# Patient Record
Sex: Male | Born: 1990 | Race: White | Hispanic: No | Marital: Single | Smoking: Never smoker
Health system: Southern US, Community
[De-identification: ages and names within clinical notes are randomized; demographics above are authoritative.]

## PROBLEM LIST (undated history)

## (undated) HISTORY — PX: TONSILECTOMY/ADENOIDECTOMY WITH MYRINGOTOMY: SHX6125

## (undated) HISTORY — PX: WISDOM TOOTH EXTRACTION: SHX21

---
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
## 2010-08-09 NOTE — Procedures (Signed)
CLINICAL INFORMATION:  This patient is being evaluated for an episode of  loss of consciousness.   TECHNICAL DESCRIPTION:  This EEG was recorded in the awake and stage 2 sleep  states. The awake background rhythm is well formed 11 and 12 Hertz rhythms  with higher amplitude seen in the posterior head regions bilaterally. No  evidence of any focal asymmetry is present. C-4 electrode artifact is  recorded during this study. Hyperventilation testing was performed without  any abnormalities. Photic stimulation did not produce a driving response.  There was no evidence of any focal asymmetry. Stage 2 sleep was noted. There  was no epileptiform activity present.   IMPRESSION:  This is a normal EEG during the awake and stage 2 sleep states.           ______________________________  Genene Churn. Sandria Manly, M.D.     ZOX:[id number]  D:  07/23/2005 11:38:09  T:  07/23/2005 23:37:02  Job #:  [id number]

## 2010-08-09 NOTE — Op Note (Signed)
NAMEKRYSTIAN, YOUNGLOVE NO.:  [id number]   MEDICAL RECORD NO.:  [id number]          PATIENT TYPE:  EMS   LOCATION:  MAJO                         FACILITY:  [facility]   PHYSICIAN:  Kinnie Scales. Annalee Genta, M.D.DATE OF BIRTH:  08-01-1990   DATE OF PROCEDURE:  02/17/2006  DATE OF DISCHARGE:                                 OPERATIVE REPORT   PRE AND POSTOPERATIVE DIAGNOSIS AND INDICATIONS FOR SURGERY:  1. Post tonsillectomy hemorrhage.   SURGICAL PROCEDURES:  1. Examination cautery under anesthesia with control post tonsillectomy      hemorrhage.  2. Gastric lavage.   ANESTHESIA:  General endotracheal,   SURGEON:  David L. Annalee Genta, M.D.   COMPLICATIONS:  None.   BLOOD LOSS:  Less than 50 mL.   The patient transferred from the operating room to recovery in stable  condition.   BRIEF HISTORY:  Eiden is a 20 year old white male who underwent  tonsillectomy on 01/2006 by Dr. Suzanna Obey.  The patient had a history of  recurrent acute tonsillitis and tonsillar hypertrophy and surgical procedure  was performed without complication or difficulty and he was discharged later  the same day.  The patient was stable and doing well in the postoperative  period until the morning of 02/17/2006 when he awoke at approximately 2:00  a.m. with episodic oropharyngeal bleeding.  The patient was trans was  brought to the [facility] emergency department and given his  history, examination which showed active bleeding from the left tonsillar  fossa and a large clot, I recommended we undertake exam under anesthesia  with control of hemorrhage and gastric lavage.  Risks, benefits and possible  complications of surgical procedure discussed in detail with the patient and  his parents and they understood and concurred with our plan for surgery  which is scheduled on an emergent basis at [facility] OR.   SURGICAL PROCEDURE:  The patient brought to the operating room at  the main  OR on 02/17/2006, placed supine position on the operating table.  General  endotracheal anesthesia established without difficulty.  There is no  evidence of aspiration.  With the patient adequately anesthetized, he was  positioned on the operating table and prepped and draped in sterile fashion  and Crowe-Davis mouth gag was inserted without difficulty.  The oral cavity,  oropharynx were thoroughly examined.  The patient had a large organized clot  in the left tonsillar fossa.  This was removed and active bleeding was noted  from the mid aspect of the left tonsillar fossa.  This area was cauterized  with suction cautery.  Tonsillar fossa were gently abraded with a dry tonsil  sponge and several other small areas of point hemorrhage were cauterized  with suction cautery.  Crowe-Davis mouth gag was released and reapplied.  There is no active bleeding, oral cavity and oropharynx were then irrigated  and suctioned.   Gastric lavage was undertaken.  A 16-French orogastric tube was then  inserted without difficulty.  There was moderate amount approximately 150 mL  of clotted blood aspirated from the stomach.  The patient was then lavaged  with a total of approximately 500 mL of warm normal saline gastric lavage in  order to clear the stomach of additional blood and clotted material.  With  lavage fluid running clear, the orogastric tube was removed without  difficulty.  The patient's oral cavity, oropharynx were then examined and  there was no active bleeding.  The patient's Crowe-Davis mouth gag was  removed.  No loose or broken teeth.  No bleeding.  He was awakened from the  anesthetic, extubated, transferred from the operating room to recovery in  stable condition.  There were no complications.           ______________________________  Kinnie Scales Annalee Genta, M.D.     DLS/MEDQ  D:  16/12/9602  T:  02/17/2006  Job:  [id number]

## 2012-05-18 ENCOUNTER — Ambulatory Visit (INDEPENDENT_AMBULATORY_CARE_PROVIDER_SITE_OTHER): Payer: 59 | Admitting: Internal Medicine

## 2012-05-18 ENCOUNTER — Telehealth: Payer: Self-pay | Admitting: Family Medicine

## 2012-05-18 VITALS — BP 119/79 | HR 80 | Temp 98.1°F | Resp 16 | Ht 73.0 in | Wt 205.0 lb

## 2012-05-18 DIAGNOSIS — F411 Generalized anxiety disorder: Secondary | ICD-10-CM

## 2012-05-18 DIAGNOSIS — G4700 Insomnia, unspecified: Secondary | ICD-10-CM

## 2012-05-18 MED ORDER — FLUOXETINE HCL 20 MG PO TABS: 20.0000 mg | ORAL_TABLET | Freq: Every day | ORAL | Status: DC

## 2012-05-18 MED ORDER — ALPRAZOLAM 0.5 MG PO TABS: 0.5000 mg | ORAL_TABLET | Freq: Three times a day (TID) | ORAL | Status: DC | PRN
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
## 2012-05-18 NOTE — Patient Instructions (Signed)
"  Overcoming anxiety for dummies" SLEEP! Very important

## 2012-05-19 NOTE — Progress Notes (Signed)
Overbook appt made for 3/12 per provider.

## 2012-06-02 ENCOUNTER — Other Ambulatory Visit: Payer: Self-pay | Admitting: Internal Medicine

## 2012-06-02 ENCOUNTER — Encounter: Payer: Self-pay | Admitting: Internal Medicine

## 2012-06-02 ENCOUNTER — Telehealth: Payer: Self-pay

## 2012-06-02 ENCOUNTER — Ambulatory Visit (INDEPENDENT_AMBULATORY_CARE_PROVIDER_SITE_OTHER): Payer: 59 | Admitting: Internal Medicine

## 2012-06-02 VITALS — BP 128/82 | HR 75 | Temp 97.7°F | Resp 16 | Ht 73.0 in | Wt 210.0 lb

## 2012-06-02 DIAGNOSIS — R61 Generalized hyperhidrosis: Secondary | ICD-10-CM

## 2012-06-02 DIAGNOSIS — Z0000 Encounter for general adult medical examination without abnormal findings: Secondary | ICD-10-CM

## 2012-06-02 DIAGNOSIS — F411 Generalized anxiety disorder: Secondary | ICD-10-CM

## 2012-06-02 LAB — COMPREHENSIVE METABOLIC PANEL
ALT: 78 U/L — ABNORMAL HIGH (ref 0–53)
Albumin: 5 g/dL (ref 3.5–5.2)
Alkaline Phosphatase: 45 U/L (ref 39–117)
Glucose, Bld: 81 mg/dL (ref 70–99)
Potassium: 3.8 mEq/L (ref 3.5–5.3)
Sodium: 139 mEq/L (ref 135–145)
Total Bilirubin: 0.7 mg/dL (ref 0.3–1.2)
Total Protein: 7.6 g/dL (ref 6.0–8.3)

## 2012-06-02 LAB — LIPID PANEL
Cholesterol: 161 mg/dL (ref 0–200)
Total CHOL/HDL Ratio: 3.7 Ratio

## 2012-06-02 LAB — CBC WITH DIFFERENTIAL/PLATELET
Basophils Absolute: 0 10*3/uL (ref 0.0–0.1)
Basophils Relative: 1 % (ref 0–1)
Eosinophils Absolute: 0.3 10*3/uL (ref 0.0–0.7)
Eosinophils Relative: 4 % (ref 0–5)
MCH: 32.3 pg (ref 26.0–34.0)
MCHC: 36 g/dL (ref 30.0–36.0)
MCV: 89.8 fL (ref 78.0–100.0)
Neutrophils Relative %: 54 % (ref 43–77)
Platelets: 226 10*3/uL (ref 150–400)
RBC: 5.08 MIL/uL (ref 4.22–5.81)
RDW: 12.8 % (ref 11.5–15.5)

## 2012-06-02 MED ORDER — ALPRAZOLAM 1 MG PO TABS: 1.0000 mg | ORAL_TABLET | Freq: Three times a day (TID) | ORAL | Status: DC | PRN

## 2012-06-02 MED ORDER — ALUMINUM CHLORIDE 20 % EX SOLN: Freq: Every day | CUTANEOUS | Status: DC

## 2012-06-02 MED ORDER — FLUOXETINE HCL 20 MG PO TABS: 20.0000 mg | ORAL_TABLET | Freq: Every day | ORAL | Status: DC

## 2012-06-02 NOTE — Telephone Encounter (Signed)
Pt wanted to inform Dr.Doolittle that he had a tetanus shot on- 06/26/2005. If any ?'s please call: [phone]

## 2012-06-02 NOTE — Progress Notes (Signed)
  Subjective:    Patient ID: Kenneth Townsend, male    DOB: [birth year], 22 y.o.   MRN: [id number]  HPIhere for PE  and f/u GAD--sleeping very well /situational anxiety, pervasive anxiety controlled at 1 mg 3 times a day but continues with excessive sweating   Lifts trying to bulk up/occas aerobics/protein supplements Sl wt gain PMH- Sh-new job/steady GF FH-parents healthy-Dad had anx Tet 2007   Review of Systems  Constitutional: Positive for diaphoresis.  Psychiatric/Behavioral: Positive for sleep disturbance. The patient is nervous/anxious.   remainder negative     Objective:   Physical Exam BP 128/82  Pulse 75  Temp(Src) 97.7 F (36.5 C)  Resp 16  Ht 6\' 1"  (1.854 m)  Wt 210 lb (95.255 kg)  BMI 27.71 kg/m2 HEENT clear Heart regular without murmur Lungs clear No thyromegaly or lymphadenopathy Abdomen supple without organomegaly No testicular masses or inguinal hernia Spine straight/straight leg raise normal Extremities clear/good range of motion about all joints Neurological intact Mood good/affect appropriate       Assessment & Plan:  Annual physical Problem #1 generalized anxiety disorder Problem #2 hyperhidrosis   continue Prozac Increase Xanax to 1 mg-half to one as needed 3 times a day drysol Followup in 3 months to assess whether Xanax can be discontinued or Prozac needs to be increased

## 2012-06-03 NOTE — Telephone Encounter (Signed)
Abstract inti immun please

## 2012-06-03 NOTE — Telephone Encounter (Signed)
done

## 2012-06-04 LAB — HEPATITIS C ANTIBODY: HCV Ab: NEGATIVE

## 2012-06-09 ENCOUNTER — Encounter: Payer: Self-pay | Admitting: Internal Medicine

## 2012-06-23 ENCOUNTER — Telehealth: Payer: Self-pay

## 2012-06-23 NOTE — Telephone Encounter (Signed)
PT SAYS HE RECEIVED AN EOB FROM HIS INSURANCE CO. FOR DOS 06-02-12.  HE SAID THE CODE 40981 WAS ON THERE FOR HEP C.  SAYS HE DID NOT HAVE THIS DONE AND THAT HE THINKS THERE WAS A TYPING ERROR IN PROCESSING HIS CLAIM.

## 2012-06-23 NOTE — Telephone Encounter (Signed)
ERROR

## 2012-07-06 ENCOUNTER — Telehealth: Payer: Self-pay

## 2012-07-06 NOTE — Telephone Encounter (Signed)
Called [facility] and spoke with [place] and changed patients Dx code to 790.4 for Hep C.  They will re-file patients insurance.  Spoke with patient and advised him they will re-file insurance.  Patient states understanding, he will call back if there are anymore questions or concerns.

## 2012-08-10 ENCOUNTER — Telehealth: Payer: Self-pay

## 2012-08-10 DIAGNOSIS — R7989 Other specified abnormal findings of blood chemistry: Secondary | ICD-10-CM | POA: Insufficient documentation

## 2012-08-10 DIAGNOSIS — R945 Abnormal results of liver function studies: Secondary | ICD-10-CM | POA: Insufficient documentation

## 2012-08-10 DIAGNOSIS — Z1159 Encounter for screening for other viral diseases: Secondary | ICD-10-CM | POA: Insufficient documentation

## 2012-08-10 NOTE — Telephone Encounter (Signed)
Spoke with patient, we have tried several codes to cover Hep C.  He's insurance still hasn't covered the Hep C.  Do you have another code we might be able to use?  Please advise

## 2012-08-10 NOTE — Telephone Encounter (Signed)
Patient Active Problem List   Diagnosis Date Noted  . Abnormal LFTs 08/10/2012  . Need for hepatitis C screening test 08/10/2012  . GAD (generalized anxiety disorder) 05/18/2012

## 2012-08-11 NOTE — Telephone Encounter (Signed)
Called [facility] and spoke with Trula Ore, changed code to V73.89.  Patient informed that Loney Loh, will resubmit claim.

## 2012-08-25 ENCOUNTER — Encounter: Payer: Self-pay | Admitting: Internal Medicine

## 2012-08-25 ENCOUNTER — Ambulatory Visit (INDEPENDENT_AMBULATORY_CARE_PROVIDER_SITE_OTHER): Payer: 59 | Admitting: Internal Medicine

## 2012-08-25 VITALS — BP 114/64 | HR 58 | Temp 97.6°F | Resp 16 | Ht 72.0 in | Wt 202.8 lb

## 2012-08-25 DIAGNOSIS — F411 Generalized anxiety disorder: Secondary | ICD-10-CM

## 2012-08-25 MED ORDER — ALPRAZOLAM 1 MG PO TABS: 1.0000 mg | ORAL_TABLET | Freq: Three times a day (TID) | ORAL | Status: DC | PRN

## 2012-08-25 MED ORDER — FLUOXETINE HCL 20 MG PO TABS: 20.0000 mg | ORAL_TABLET | Freq: Every day | ORAL | Status: DC

## 2012-08-25 NOTE — Progress Notes (Signed)
  Subjective:    Patient ID: Kenneth Townsend, male    DOB: [birth year], 22 y.o.   MRN: [id number]  HPI f/u for anxiety Has begun to improve after 5 weeks of Prozac requiring less Xanax Will move to [place] to start new job with [employer] and anticipates increase in anxiety Would like to continue to have access to Xanax which worked very well when this episode of anxiety started He understands that an increase in Prozac may be very beneficial as well No depression/suicide ideation Doing well with exercise Feels good in general/no further liver issues but will need repeat testing at a later followup    Review of Systems No fatigue weight loss or night sweats No change in activity Only requires drysol twice a month    Objective:   Physical Exam BP 114/64  Pulse 58  Temp(Src) 97.6 F (36.4 C) (Oral)  Resp 16  Ht 6' (1.829 m)  Wt 202 lb 12.8 oz (91.989 kg)  BMI 27.5 kg/m2  SpO2 99% Mood good/affect appropriate       Assessment & Plan:  GAD (generalized anxiety disorder) - Plan: FLUoxetine (PROZAC) 20 MG tablet, ALPRAZolam (XANAX) 1 MG tablet  Meds ordered this encounter  Medications  . FLUoxetine (PROZAC) 20 MG tablet    Sig: Take 1 tablet (20 mg total) by mouth daily.    Dispense:  90 tablet    Refill:  2  . ALPRAZolam (XANAX) 1 MG tablet    Sig: Take 1 tablet (1 mg total) by mouth 3 (three) times daily as needed.    Dispense:  90 tablet    Refill:  5   F/u 02/2013 reck LFTs plus hep c Ab

## 2013-04-05 ENCOUNTER — Other Ambulatory Visit: Payer: Self-pay | Admitting: Internal Medicine

## 2013-04-06 NOTE — Telephone Encounter (Signed)
faxed

## 2013-04-06 NOTE — Telephone Encounter (Signed)
Pt called and notified overdue for OV. Pt stated he will CB and set up appt.

## 2013-04-06 NOTE — Telephone Encounter (Signed)
Needs OV, was due for f/u 02/2013 per last OV note

## 2013-05-18 ENCOUNTER — Ambulatory Visit: Payer: 59 | Admitting: Internal Medicine

## 2013-05-24 ENCOUNTER — Ambulatory Visit (INDEPENDENT_AMBULATORY_CARE_PROVIDER_SITE_OTHER): Payer: Managed Care, Other (non HMO) | Admitting: Internal Medicine

## 2013-05-24 VITALS — BP 120/88 | HR 86 | Temp 98.0°F | Resp 16 | Ht 72.5 in | Wt 208.0 lb

## 2013-05-24 DIAGNOSIS — L74519 Primary focal hyperhidrosis, unspecified: Secondary | ICD-10-CM

## 2013-05-24 DIAGNOSIS — R61 Generalized hyperhidrosis: Secondary | ICD-10-CM

## 2013-05-24 DIAGNOSIS — F411 Generalized anxiety disorder: Secondary | ICD-10-CM

## 2013-05-24 DIAGNOSIS — G4700 Insomnia, unspecified: Secondary | ICD-10-CM

## 2013-05-24 MED ORDER — ALPRAZOLAM 1 MG PO TABS: 1.0000 mg | ORAL_TABLET | Freq: Three times a day (TID) | ORAL | Status: DC | PRN

## 2013-05-24 MED ORDER — FLUOXETINE HCL 20 MG PO TABS: 20.0000 mg | ORAL_TABLET | Freq: Every day | ORAL | Status: DC

## 2013-05-24 MED ORDER — ALUMINUM CHLORIDE 20 % EX SOLN: Freq: Every day | CUTANEOUS | Status: DC

## 2013-05-24 NOTE — Progress Notes (Addendum)
   Subjective:    Patient ID: Kenneth Townsend, male    DOB: 26-Feb-1991, 23 y.o.   MRN: [id number] This chart was scribed for Ellamae Siaobert Tram Wrenn, MD by Nicholos Johnsenise Iheanachor, Medical Scribe. This patient's care was started at 8:29 PM.  HPI HPI Comments: Kenneth Townsend is a 23 y.o. male who [facility] for a medication refill. Patient Active Problem List  . GAD (generalized anxiety disorder) 05/18/2012    -  Hyperhidrosis-axillary and palmar  Has been on Prozac for a while; ran out 1 month ago. Unsure if he has been having any withdrawals. Says he has noticed anxiety has returned full force but is unsure how much of that was attributed to his career versus the medication withdrawal. One recurring sx pt states he has noticed since coming off the medication is changed in sleep pattern. Says he has had many restless nights. Also uses Xanax once in the AM and another if needed sometime throughout the day. Reports success with the use of Drysol and states once coming off of that he noticed sx came back almost immediately. States he does have some hyperhidrosis with his hands. Pt is otherwise healthy. Tries to work out regularly. Has a girlfriend that cooks most of the meals and helps to maintain a healthy diet.  Review of Systems No headaches or visual disturbances No unusual weight gain No chest pain or palpitations No neurological symptoms No history of depression Past history of abnormal LFTs 05/2012 very mild with negative hep C  Objective:   Physical Exam  Nursing note and vitals reviewed. Constitutional: He is oriented to person, place, and time. He appears well-developed and well-nourished. No distress.  HENT:  Head: Normocephalic and atraumatic.  Eyes: Conjunctivae and EOM are normal. Pupils are equal, round, and reactive to light.  Neck: Neck supple.  Cardiovascular: Normal rate.   Pulmonary/Chest: Effort normal.  Neurological: He is alert and oriented to person, place, and time. No  cranial nerve deficit.  Psychiatric: He has a normal mood and affect. His behavior is normal.   Assessment & Plan:  I have completed the patient encounter in its entirety as documented by the scribe, with editing by me where necessary. Shenicka Sunderlin P. Merla Richesoolittle, M.D.  GAD (generalized anxiety disorder) - Plan: FLUoxetine (PROZAC) 20 MG tablet, ALPRAZolam (XANAX) 1 MG tablet----meds restarted  Hyperhidrosis - Plan: aluminum chloride (DRYSOL) 20 % external solution  Insomnia, unspecified  Meds ordered this encounter  Medications  . FLUoxetine (PROZAC) 20 MG tablet    Sig: Take 1 tablet (20 mg total) by mouth daily.    Dispense:  90 tablet    Refill:  3  . ALPRAZolam (XANAX) 1 MG tablet    Sig: Take 1 tablet (1 mg total) by mouth 3 (three) times daily as needed for anxiety.    Dispense:  90 tablet    Refill:  5  . aluminum chloride (DRYSOL) 20 % external solution    Sig: Apply topically at bedtime. once sweating is controlled may decrease use to one to 2 times per week    Dispense:  35 mL    Refill:  5   Followup 6 months or call if stable At next visit will recheck liver function studies

## 2013-07-06 ENCOUNTER — Encounter: Payer: 59 | Admitting: Internal Medicine

## 2013-07-18 NOTE — Progress Notes (Signed)
This encounter was created in error - please disregard.

## 2013-12-27 ENCOUNTER — Other Ambulatory Visit: Payer: Self-pay | Admitting: Internal Medicine

## 2013-12-28 NOTE — Telephone Encounter (Signed)
Called in.

## 2014-03-14 ENCOUNTER — Ambulatory Visit (INDEPENDENT_AMBULATORY_CARE_PROVIDER_SITE_OTHER): Payer: Managed Care, Other (non HMO) | Admitting: Family Medicine

## 2014-03-14 ENCOUNTER — Encounter: Payer: Self-pay | Admitting: Family Medicine

## 2014-03-14 VITALS — BP 115/78 | HR 75 | Temp 97.4°F | Resp 16 | Ht 73.0 in | Wt 204.0 lb

## 2014-03-14 DIAGNOSIS — R61 Generalized hyperhidrosis: Secondary | ICD-10-CM

## 2014-03-14 DIAGNOSIS — F411 Generalized anxiety disorder: Secondary | ICD-10-CM

## 2014-03-14 DIAGNOSIS — R945 Abnormal results of liver function studies: Secondary | ICD-10-CM

## 2014-03-14 DIAGNOSIS — R7989 Other specified abnormal findings of blood chemistry: Secondary | ICD-10-CM

## 2014-03-14 LAB — HEPATIC FUNCTION PANEL
ALT: 15 U/L (ref 0–53)
AST: 14 U/L (ref 0–37)
Albumin: 5.1 g/dL (ref 3.5–5.2)
Alkaline Phosphatase: 42 U/L (ref 39–117)
BILIRUBIN INDIRECT: 0.8 mg/dL (ref 0.2–1.2)
Bilirubin, Direct: 0.2 mg/dL (ref 0.0–0.3)
TOTAL PROTEIN: 7.7 g/dL (ref 6.0–8.3)
Total Bilirubin: 1 mg/dL (ref 0.2–1.2)

## 2014-03-14 MED ORDER — GLYCOPYRROLATE 1 MG PO TABS: 1.0000 mg | ORAL_TABLET | Freq: Two times a day (BID) | ORAL | Status: AC

## 2014-03-14 MED ORDER — ALPRAZOLAM 1 MG PO TABS: 1.0000 mg | ORAL_TABLET | Freq: Three times a day (TID) | ORAL | Status: DC | PRN

## 2014-03-14 MED ORDER — ALUMINUM CHLORIDE 20 % EX SOLN: Freq: Every day | CUTANEOUS | Status: AC

## 2014-03-14 MED ORDER — FLUOXETINE HCL 20 MG PO TABS: 20.0000 mg | ORAL_TABLET | Freq: Every day | ORAL | Status: AC

## 2014-03-14 NOTE — Progress Notes (Signed)
   Subjective:    Patient ID: Kenneth Townsend, male    DOB: 1990/12/06, 23 y.o.   MRN: [id number]  HPI Patient presents today for follow up of GAD, hyperhydrosis. Is not finding drysol to be as effective as it once was. Has axillary and palmar sweating. 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 He is working more in [employer] and is living in [place]. He is home visiting his parents for Christmas.   Has had some anxiety with change in his territory, but feels that prozac and xanax are working well for him. He has been taking xanax 2-3 x day recently. He exercises regularly and sleeps well once getting to sleep.    Review of Systems No chest pain/SOB, no fever/chills    Objective:   Physical Exam  Constitutional: He is oriented to person, place, and time. He appears well-developed and well-nourished.  HENT:  Head: Normocephalic and atraumatic.  Eyes: Conjunctivae are normal.  Neck: Normal range of motion.  Cardiovascular: Normal rate.   Pulmonary/Chest: Effort normal.  Musculoskeletal: Normal range of motion.  Neurological: He is alert and oriented to person, place, and time.  Psychiatric: He has a normal mood and affect. His behavior is normal. Judgment and thought content normal.  Vitals reviewed. BP 115/78 mmHg  Pulse 75  Temp(Src) 97.4 F (36.3 C) (Oral)  Resp 16  Ht 6\' 1"  (1.854 m)  Wt 204 lb (92.534 kg)  BMI 26.92 kg/m2  SpO2 99%    Assessment & Plan:  1. GAD (generalized anxiety disorder) - FLUoxetine (PROZAC) 20 MG tablet; Take 1 tablet (20 mg total) by mouth daily.  Dispense: 90 tablet; Refill: 3 - ALPRAZolam (XANAX) 1 MG tablet; Take 1 tablet (1 mg total) by mouth 3 (three) times daily as needed for anxiety.  Dispense: 90 tablet; Refill: 0 - discussed importance of stress reducing activities- exercise, meditation/deep breathing -Encouraged reduction of xanax use  -Encouraged him to find primary care locally as he is spending more  time in [place]  2. Hyperhidrosis - glycopyrrolate (ROBINUL) 1 MG tablet; Take 1 tablet (1 mg total) by mouth 2 (two) times daily.  Dispense: 60 tablet; Refill: 5 - aluminum chloride (DRYSOL) 20 % external solution; Apply topically at bedtime. once sweating is controlled may decrease use to one to 2 times per week  Dispense: 35 mL; Refill: 5  3. Elevated LFTs - Hepatic Function Panel   Emi Belfasteborah B. Gessner, FNP-BC  [facility]  03/16/2014 3:36 PM

## 2014-05-01 ENCOUNTER — Other Ambulatory Visit: Payer: Self-pay

## 2014-05-01 DIAGNOSIS — F411 Generalized anxiety disorder: Secondary | ICD-10-CM

## 2014-05-01 NOTE — Telephone Encounter (Signed)
Patient states he is out of his alprazolam rx and when he went to his pharmacy they said he didn't have any refills. He says that he usually sees Dr. Merla Richesoolittle and gets all of his rx's written with 5 refills but he was seen by Deboraha Sprangebbie Gessner in December. She gave is other meds refills but not the alprazolam. Can is get refills? He has been out of his medication since the end of January. Cb# [id number].

## 2014-05-03 NOTE — Telephone Encounter (Signed)
Patient is completely out of his medicine and is needing a refill.  Patient states he was told that someone would call him back by tomorrow and he has not heard anything.     Best#: [phone]

## 2014-05-05 NOTE — Telephone Encounter (Signed)
Pt called regarding this issue. Pt is very disappointed that his rx has not been refilled nor has he received any calls about this.  Is there any way that this rx can be called in? Pt lives in [place] and normally receives a written rx with refills but he did not receive that during his last OV.   Pt uses [facility] in [place] - their phone number is [phone]

## 2014-05-05 NOTE — Telephone Encounter (Signed)
[name], Kenneth Townsend was in the office earlier in the week, but she must not have seen this. Can you please address in her absence today?

## 2014-05-06 MED ORDER — ALPRAZOLAM 1 MG PO TABS
1.0000 mg | ORAL_TABLET | Freq: Three times a day (TID) | ORAL | Status: AC | PRN
Start: 2014-05-06 — End: ?

## 2014-05-06 NOTE — Telephone Encounter (Signed)
Faxed. Pt notified 

## 2014-05-06 NOTE — Telephone Encounter (Signed)
Ready - he did not get refills because Eunice BlaseDebbie is not able to write for refills on the Rx -

## 2015-02-19 ENCOUNTER — Encounter: Payer: Self-pay | Admitting: Internal Medicine
